# Patient Record
Sex: Male | Born: 1990 | Marital: Single
Health system: Southern US, Community
[De-identification: ages and names within clinical notes are randomized; demographics above are authoritative.]

---

## 2017-01-06 ENCOUNTER — Emergency Department (HOSPITAL_COMMUNITY)
Admission: EM | Admit: 2017-01-06 | Discharge: 2017-01-06 | Disposition: A | Discharge status: Home / Self Care | Payer: Managed Care, Other (non HMO) | Attending: Emergency Medicine | Admitting: Emergency Medicine

## 2017-01-06 ENCOUNTER — Emergency Department (HOSPITAL_COMMUNITY): Payer: Managed Care, Other (non HMO)

## 2017-01-06 ENCOUNTER — Encounter (HOSPITAL_COMMUNITY): Payer: Self-pay | Admitting: Emergency Medicine

## 2017-01-06 DIAGNOSIS — Y939 Activity, unspecified: Secondary | ICD-10-CM | POA: Diagnosis not present

## 2017-01-06 DIAGNOSIS — M79641 Pain in right hand: Secondary | ICD-10-CM | POA: Insufficient documentation

## 2017-01-06 DIAGNOSIS — Y999 Unspecified external cause status: Secondary | ICD-10-CM | POA: Diagnosis not present

## 2017-01-06 DIAGNOSIS — Z23 Encounter for immunization: Secondary | ICD-10-CM | POA: Diagnosis not present

## 2017-01-06 LAB — BASIC METABOLIC PANEL
ANION GAP: 9 (ref 5–15)
BUN: 10 mg/dL (ref 6–20)
CHLORIDE: 101 mmol/L (ref 101–111)
CO2: 25 mmol/L (ref 22–32)
Calcium: 9.2 mg/dL (ref 8.9–10.3)
Creatinine, Ser: 1.04 mg/dL (ref 0.61–1.24)
GFR calc non Af Amer: >60 mL/min (ref >60)
GLUCOSE: 91 mg/dL (ref 65–99)
Potassium: 3.7 mmol/L (ref 3.5–5.1)
Sodium: 135 mmol/L (ref 135–145)

## 2017-01-06 LAB — CBC WITH DIFFERENTIAL/PLATELET
BASOS PCT: 0 %
Basophils Absolute: 0 10*3/uL (ref 0.0–0.1)
Eosinophils Absolute: 0 10*3/uL (ref 0.0–0.7)
Eosinophils Relative: 0 %
HEMATOCRIT: 41.8 % (ref 39.0–52.0)
HEMOGLOBIN: 15 g/dL (ref 13.0–17.0)
LYMPHS ABS: 1.7 10*3/uL (ref 0.7–4.0)
LYMPHS PCT: 14 %
MCH: 31.2 pg (ref 26.0–34.0)
MCHC: 35.9 g/dL (ref 30.0–36.0)
MCV: 86.9 fL (ref 78.0–100.0)
Monocytes Absolute: 0.6 10*3/uL (ref 0.1–1.0)
Monocytes Relative: 5 %
NEUTROS ABS: 10.1 10*3/uL — AB (ref 1.7–7.7)
NEUTROS PCT: 81 %
Platelets: 254 10*3/uL (ref 150–400)
RBC: 4.81 MIL/uL (ref 4.22–5.81)
RDW: 11.7 % (ref 11.5–15.5)
WBC: 12.5 10*3/uL — ABNORMAL HIGH (ref 4.0–10.5)

## 2017-01-06 MED ORDER — HYDROCODONE-ACETAMINOPHEN 5-325 MG PO TABS
1.0000 | ORAL_TABLET | Freq: Once | ORAL | Status: AC
  Administered 2017-01-06: 1 via ORAL
  Filled 2017-01-06: qty 1

## 2017-01-06 MED ORDER — HYDROCODONE-ACETAMINOPHEN 5-325 MG PO TABS: 1.0000 | ORAL_TABLET | Freq: Four times a day (QID) | ORAL | 0 refills | Status: AC | PRN

## 2017-01-06 MED ORDER — TETANUS-DIPHTH-ACELL PERTUSSIS 5-2.5-18.5 LF-MCG/0.5 IM SUSP
0.5000 mL | Freq: Once | INTRAMUSCULAR | Status: AC
  Administered 2017-01-06: 0.5 mL via INTRAMUSCULAR
  Filled 2017-01-06: qty 0.5
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
## 2017-01-06 NOTE — ED Notes (Signed)
Bacitracin applied to wound. Wound dressed. Pt educated about wound care and given supplies to take home.

## 2017-01-06 NOTE — Discharge Instructions (Signed)
Please use bacitracin to the abrasion twice daily and keep them clean. Return for worsening pain, redness, swelling, drainage or fevers.

## 2017-01-06 NOTE — ED Notes (Signed)
Pt verbalizes understanding of d/c instructions. Pt received prescriptions. Pt ambulatory at d/c with all belongings.  
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
